# Patient Record
Sex: Male | Born: 1995 | Race: Black or African American | Hispanic: No | Marital: Single | Smoking: Never smoker
Health system: Southern US, Community
[De-identification: ages and names within clinical notes are randomized; demographics above are authoritative.]

## PROBLEM LIST (undated history)

## (undated) DIAGNOSIS — R569 Unspecified convulsions: Secondary | ICD-10-CM

## (undated) DIAGNOSIS — G039 Meningitis, unspecified: Secondary | ICD-10-CM

---

## 2017-03-04 ENCOUNTER — Emergency Department (HOSPITAL_COMMUNITY): Payer: No Typology Code available for payment source

## 2017-03-04 ENCOUNTER — Emergency Department (HOSPITAL_COMMUNITY)
Admission: EM | Admit: 2017-03-04 | Discharge: 2017-03-04 | Disposition: A | Discharge status: Home / Self Care | Payer: No Typology Code available for payment source | Source: Home / Self Care | Attending: Emergency Medicine | Admitting: Emergency Medicine

## 2017-03-04 ENCOUNTER — Encounter (HOSPITAL_COMMUNITY): Payer: Self-pay | Admitting: Emergency Medicine

## 2017-03-04 DIAGNOSIS — T8509XA Other mechanical complication of ventricular intracranial (communicating) shunt, initial encounter: Secondary | ICD-10-CM | POA: Diagnosis not present

## 2017-03-04 DIAGNOSIS — K529 Noninfective gastroenteritis and colitis, unspecified: Secondary | ICD-10-CM

## 2017-03-04 DIAGNOSIS — R51 Headache: Secondary | ICD-10-CM | POA: Insufficient documentation

## 2017-03-04 DIAGNOSIS — R400 Somnolence: Secondary | ICD-10-CM | POA: Diagnosis not present

## 2017-03-04 DIAGNOSIS — G919 Hydrocephalus, unspecified: Secondary | ICD-10-CM

## 2017-03-04 DIAGNOSIS — R519 Headache, unspecified: Secondary | ICD-10-CM

## 2017-03-04 HISTORY — DX: Meningitis, unspecified: G03.9

## 2017-03-04 HISTORY — DX: Unspecified convulsions: R56.9

## 2017-03-04 LAB — CBC
HCT: 46.9 % (ref 39.0–52.0)
Hemoglobin: 16.5 g/dL (ref 13.0–17.0)
MCH: 30.3 pg (ref 26.0–34.0)
MCHC: 35.2 g/dL (ref 30.0–36.0)
MCV: 86.2 fL (ref 78.0–100.0)
PLATELETS: 292 10*3/uL (ref 150–400)
RBC: 5.44 MIL/uL (ref 4.22–5.81)
RDW: 13 % (ref 11.5–15.5)
WBC: 10.2 10*3/uL (ref 4.0–10.5)

## 2017-03-04 LAB — COMPREHENSIVE METABOLIC PANEL
ALBUMIN: 4.2 g/dL (ref 3.5–5.0)
ALK PHOS: 81 U/L (ref 38–126)
ALT: 35 U/L (ref 17–63)
AST: 24 U/L (ref 15–41)
Anion gap: 9 (ref 5–15)
BILIRUBIN TOTAL: 1.2 mg/dL (ref 0.3–1.2)
BUN: 12 mg/dL (ref 6–20)
CALCIUM: 9.3 mg/dL (ref 8.9–10.3)
CO2: 24 mmol/L (ref 22–32)
CREATININE: 0.96 mg/dL (ref 0.61–1.24)
Chloride: 102 mmol/L (ref 101–111)
GFR calc Af Amer: >60 mL/min (ref >60)
GFR calc non Af Amer: >60 mL/min (ref >60)
GLUCOSE: 120 mg/dL — AB (ref 65–99)
Potassium: 3.8 mmol/L (ref 3.5–5.1)
Sodium: 135 mmol/L (ref 135–145)
TOTAL PROTEIN: 8 g/dL (ref 6.5–8.1)

## 2017-03-04 LAB — LIPASE, BLOOD: Lipase: 33 U/L (ref 11–51)

## 2017-03-04 MED ORDER — ONDANSETRON HCL 4 MG/2ML IJ SOLN
4.0000 mg | Freq: Once | INTRAMUSCULAR | Status: AC
  Administered 2017-03-04: 4 mg via INTRAVENOUS
  Filled 2017-03-04: qty 2

## 2017-03-04 MED ORDER — PROMETHAZINE HCL 25 MG PO TABS: 25.0000 mg | ORAL_TABLET | Freq: Four times a day (QID) | ORAL | 1 refills | Status: AC | PRN

## 2017-03-04 MED ORDER — SODIUM CHLORIDE 0.9 % IV SOLN
INTRAVENOUS | Status: DC
  Administered 2017-03-04: 18:00:00 via INTRAVENOUS

## 2017-03-04 MED ORDER — SODIUM CHLORIDE 0.9 % IV BOLUS (SEPSIS)
1000.0000 mL | Freq: Once | INTRAVENOUS | Status: AC
  Administered 2017-03-04: 1000 mL via INTRAVENOUS

## 2017-03-04 MED ORDER — HYDROMORPHONE HCL 1 MG/ML IJ SOLN
1.0000 mg | Freq: Once | INTRAMUSCULAR | Status: AC
  Administered 2017-03-04: 1 mg via INTRAVENOUS
  Filled 2017-03-04: qty 1

## 2017-03-04 MED ORDER — SODIUM CHLORIDE 0.9 % IV BOLUS (SEPSIS)
500.0000 mL | Freq: Once | INTRAVENOUS | Status: AC
  Administered 2017-03-04: 500 mL via INTRAVENOUS

## 2017-03-04 MED ORDER — DIPHENHYDRAMINE HCL 50 MG/ML IJ SOLN
50.0000 mg | Freq: Once | INTRAMUSCULAR | Status: AC
  Administered 2017-03-04: 50 mg via INTRAVENOUS
  Filled 2017-03-04: qty 1

## 2017-03-04 MED ORDER — METOCLOPRAMIDE HCL 5 MG/ML IJ SOLN
10.0000 mg | Freq: Once | INTRAMUSCULAR | Status: AC
Start: 2017-03-04 — End: 2017-03-04
  Administered 2017-03-04: 10 mg via INTRAVENOUS
  Filled 2017-03-04: qty 2

## 2017-03-04 MED ORDER — DEXAMETHASONE SODIUM PHOSPHATE 10 MG/ML IJ SOLN
10.0000 mg | Freq: Once | INTRAMUSCULAR | Status: AC
  Administered 2017-03-04: 10 mg via INTRAVENOUS
  Filled 2017-03-04: qty 1

## 2017-03-04 NOTE — ED Notes (Signed)
Pt transported to CT ?

## 2017-03-04 NOTE — Discharge Instructions (Signed)
Return for any new or worse symptoms. Follow-up with neurology regarding your hydrocephalus which we feel is chronic. Would not expect development of fever return for that. Rest tonight. Take the Phenergan as needed for the nausea.

## 2017-03-04 NOTE — ED Notes (Signed)
Headache started Thursday -- has hx of hydrocephalus with shunt----- no recent care for shunt-- father is retired Midwife-- has not have any follow up since age 21.  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

## 2017-03-04 NOTE — ED Provider Notes (Signed)
[facility]-EMERGENCY DEPT Provider Note   CSN: [id number] Arrival date & time: 03/04/17  1509     History   Chief Complaint Chief Complaint  Patient presents with  . Headache  . Abdominal Pain    HPI Miguel Stafford is a 21 y.o. male.  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 Had a shunt placed. Has not had any revisions to the shunt since age 67. 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 Fairly certain that he has meningitis shot. 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 Patient has not had any history of headaches. Patient started not feeling well on Thursday morning. At headache nausea vomiting and diarrhea. Has continued to have some vomiting. Denies any fever or chills. No blood in the vomit. No significant abdominal pain. No dysuria. No unusual rash.   Patient at triage was noted to be alert and oriented 4. Patient does have some sensitivity to light. Patient denies any other focal neuro deficits. Patient denies any neck pain or low back pain.  Later patient related that he did have a history of a bad headache one time before and they found that he had frontal sinusitis.      Past Medical History:  Diagnosis Date  . Meningitis   . Seizures (HCC)     There are no active problems to display for this patient.   History reviewed. No pertinent surgical history.     Home Medications    Prior to Admission medications   Medication Sig Start Date End Date Taking? Authorizing Provider  acetaminophen (TYLENOL) 500 MG tablet Take 1,000 mg by mouth every 6 (six) hours as needed for mild pain.   Yes [provider]  promethazine (PHENERGAN) 25 MG tablet Take 1 tablet (25 mg total) by mouth every 6 (six) hours as needed for nausea or vomiting. 03/04/17   Vanetta Mulders, MD    Family History No family history on file.  Social History Social History  Substance Use Topics  . Smoking status: Never Smoker    . Smokeless tobacco: Never Used  . Alcohol use No     Allergies   Patient has no known allergies.   Review of Systems Review of Systems  Constitutional: Positive for fatigue. Negative for chills and fever.  HENT: Negative for congestion.   Eyes: Positive for photophobia.  Respiratory: Negative for shortness of breath.   Cardiovascular: Negative for chest pain.  Gastrointestinal: Positive for diarrhea, nausea and vomiting. Negative for abdominal pain.  Genitourinary: Negative for dysuria and hematuria.  Musculoskeletal: Negative for back pain, neck pain and neck stiffness.  Skin: Negative for rash.  Neurological: Positive for headaches. Negative for seizures, syncope, facial asymmetry, speech difficulty, weakness and numbness.  Hematological: Does not bruise/bleed easily.  Psychiatric/Behavioral: Negative for confusion.     Physical Exam Updated Vital Signs BP 117/87   Pulse 84   Temp 98.2 F (36.8 C) (Oral)   Resp 16   Ht 1.702 m ( )   Wt 99.8 kg (220 lb)   SpO2 98%   BMI 34.46 kg/m   Physical Exam  Constitutional: He is oriented to person, place, and time. He appears well-developed and well-nourished. No distress.  HENT:  Head: Normocephalic and atraumatic.  Mouth/Throat: Oropharynx is clear and moist.  Eyes: Pupils are equal, round, and reactive to light. Conjunctivae and EOM are normal.  Neck: Normal range of motion. Neck supple.  Cardiovascular: Normal rate, regular  rhythm and normal heart sounds.   No murmur heard. Pulmonary/Chest: Effort normal and breath sounds normal. No respiratory distress.  Abdominal: Soft. Bowel sounds are normal. There is no tenderness.  Musculoskeletal: Normal range of motion. He exhibits no edema.  Neurological: He is alert and oriented to person, place, and time. No cranial nerve deficit. He exhibits normal muscle tone. Coordination normal.  Skin: Skin is warm. Capillary refill takes less than 2 seconds. No rash noted.   Nursing note and vitals reviewed.    ED Treatments / Results  Labs (all labs ordered are listed, but only abnormal results are displayed) Labs Reviewed  COMPREHENSIVE METABOLIC PANEL - Abnormal; Notable for the following:       Result Value   Glucose, Bld 120 (*)    All other components within normal limits  LIPASE, BLOOD  CBC  URINALYSIS, ROUTINE W REFLEX MICROSCOPIC    EKG  EKG Interpretation None       Radiology Ct Head Wo Contrast  Result Date: 03/04/2017 CLINICAL DATA:  NAUSEA AND VOMITING.  HEADACHE EXAM: CT HEAD WITHOUT CONTRAST TECHNIQUE: Contiguous axial images were obtained from the base of the skull through the vertex without intravenous contrast. COMPARISON:  None. FINDINGS: Brain: No acute intracranial hemorrhage. No focal mass lesion. No CT evidence of acute infarction. No midline shift or mass effect. Basilar cisterns are patent. Ventriculostomy catheter from a RIGHT parietal approach with tip in the RIGHT lateral ventricle. There is moderate hydrocephalus with prominence of the temporal horns. No comparison available. Vascular: No hyperdense vessel or unexpected calcification. Skull: Normal. Negative for fracture or focal lesion. Sinuses/Orbits: Paranasal sinuses and mastoid air cells are clear. Orbits are clear. Other: None. IMPRESSION: 1. 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 No comparison. 2. No acute intracranial findings. Electronically Signed   By: Genevive Bi M.D.   On: 03/04/2017 18:50    Procedures Procedures (including critical care time)  Medications Ordered in ED Medications  0.9 %  sodium chloride infusion ( Intravenous New Bag/Given 03/04/17 1752)  sodium chloride 0.9 % bolus 500 mL (0 mLs Intravenous Stopped 03/04/17 1921)  ondansetron (ZOFRAN) injection 4 mg (4 mg Intravenous Given 03/04/17 1754)  HYDROmorphone (DILAUDID) injection 1 mg (1 mg Intravenous Given 03/04/17 1752)  HYDROmorphone  (DILAUDID) injection 1 mg (1 mg Intravenous Given 03/04/17 1929)  dexamethasone (DECADRON) injection 10 mg (10 mg Intravenous Given 03/04/17 1938)  diphenhydrAMINE (BENADRYL) injection 50 mg (50 mg Intravenous Given 03/04/17 1936)  metoCLOPramide (REGLAN) injection 10 mg (10 mg Intravenous Given 03/04/17 1937)  sodium chloride 0.9 % bolus 1,000 mL (0 mLs Intravenous Stopped 03/04/17 2057)     Initial Impression / Assessment and Plan / ED Course  I have reviewed the triage vital signs and the nursing notes.  Pertinent labs & imaging results that were available during my care of the patient were reviewed by me and considered in my medical decision making (see chart for details).    CT of the head showed moderate hydrocephalus. No CT for comparison. This was discussed with Dr. Roseanne Reno on call for neurology. He reviewed the head CT. Felt that these were most likely chronic in nature did not see any acute changes. And also based on patient's presentation with him being alert and oriented answering question although miserable from the headache had no lethargy. No focal neuro deficits other than the photophobia. That this was not an acute hydrocephalus process and that the shunt was probably working properly.  Based  on this decided to treat him as possible headache or new onset migraine. He is in his 87s. Patient treated with Decadron Reglan and Benadryl and IV fluids. Also earlier had received hydromorphone and got an second dose of hydromorphone. Patient was significant improvement with this. Headache went from 10 out of 10 down to 4 out of 10. Patient stated he felt much better.  Reevaluate the patient carefully. No neck stiffness no back stiffness. Photophobia had resolved. Patient was mentating fine. Able to count backwards from 10-1 without any difficulties. Able to relate a senior at school and what is majoring in. Patient's parents are here and will be able to watch him. Clinically do not feel that  there is any meningitis. There is no fever. There is no meningeal symptoms. Clinically feel that this may very well be new onset migraine certainly think is to be repetitive that'll clinch it. 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 Patient will return for any new or worse symptoms at all.   Final Clinical Impressions(s) / ED Diagnoses   Final diagnoses:  Acute intractable headache, unspecified headache type  Gastroenteritis  Hydrocephalus    New Prescriptions New Prescriptions   PROMETHAZINE (PHENERGAN) 25 MG TABLET    Take 1 tablet (25 mg total) by mouth every 6 (six) hours as needed for nausea or vomiting.     Vanetta Mulders, MD 03/04/17 [phone]

## 2017-03-04 NOTE — ED Notes (Signed)
Dr. Zackowski in room. 

## 2017-03-04 NOTE — ED Triage Notes (Signed)
Pt c/o headache that began Thursday morning as well as abdominal pain that began yesterday. Endorses n/v, denies diarrhea. Pt also c/o sensitivity to light, hx of shunt placement. A&O x 4.

## 2017-03-04 NOTE — ED Notes (Signed)
Pt verbalized understanding of d/c instructions and has no further questions. Pt stable and nAD 

## 2017-03-05 ENCOUNTER — Inpatient Hospital Stay (HOSPITAL_COMMUNITY)
Admission: EM | Admit: 2017-03-05 | Discharge: 2017-03-06 | DRG: 982 | Disposition: A | Discharge status: Home / Self Care | Payer: No Typology Code available for payment source | Attending: Neurosurgery | Admitting: Neurosurgery

## 2017-03-05 ENCOUNTER — Emergency Department (HOSPITAL_COMMUNITY): Payer: No Typology Code available for payment source | Admitting: Certified Registered"

## 2017-03-05 ENCOUNTER — Encounter (HOSPITAL_COMMUNITY)
Admission: EM | Disposition: A | Discharge status: Home / Self Care | Payer: Self-pay | Source: Home / Self Care | Attending: Neurosurgery

## 2017-03-05 ENCOUNTER — Emergency Department (HOSPITAL_COMMUNITY): Payer: No Typology Code available for payment source

## 2017-03-05 ENCOUNTER — Inpatient Hospital Stay (HOSPITAL_COMMUNITY): Payer: No Typology Code available for payment source

## 2017-03-05 ENCOUNTER — Encounter (HOSPITAL_COMMUNITY): Payer: Self-pay | Admitting: *Deleted

## 2017-03-05 DIAGNOSIS — R519 Headache, unspecified: Secondary | ICD-10-CM

## 2017-03-05 DIAGNOSIS — T8509XA Other mechanical complication of ventricular intracranial (communicating) shunt, initial encounter: Secondary | ICD-10-CM | POA: Diagnosis present

## 2017-03-05 DIAGNOSIS — Z8661 Personal history of infections of the central nervous system: Secondary | ICD-10-CM

## 2017-03-05 DIAGNOSIS — Y838 Other surgical procedures as the cause of abnormal reaction of the patient, or of later complication, without mention of misadventure at the time of the procedure: Secondary | ICD-10-CM | POA: Diagnosis present

## 2017-03-05 DIAGNOSIS — G918 Other hydrocephalus: Secondary | ICD-10-CM | POA: Diagnosis present

## 2017-03-05 DIAGNOSIS — R51 Headache: Secondary | ICD-10-CM

## 2017-03-05 DIAGNOSIS — R400 Somnolence: Secondary | ICD-10-CM | POA: Diagnosis present

## 2017-03-05 DIAGNOSIS — R5383 Other fatigue: Secondary | ICD-10-CM | POA: Diagnosis present

## 2017-03-05 DIAGNOSIS — Z452 Encounter for adjustment and management of vascular access device: Secondary | ICD-10-CM

## 2017-03-05 DIAGNOSIS — T85618A Breakdown (mechanical) of other specified internal prosthetic devices, implants and grafts, initial encounter: Secondary | ICD-10-CM | POA: Diagnosis present

## 2017-03-05 HISTORY — PX: SHUNT REVISION: SHX343

## 2017-03-05 LAB — CBG MONITORING, ED: GLUCOSE-CAPILLARY: 105 mg/dL — AB (ref 65–99)

## 2017-03-05 SURGERY — SHUNT REVISION
Anesthesia: General | Site: Head | Laterality: Right

## 2017-03-05 MED ORDER — BACITRACIN ZINC 500 UNIT/GM EX OINT
TOPICAL_OINTMENT | CUTANEOUS | Status: DC | PRN
  Administered 2017-03-05: 1 via TOPICAL

## 2017-03-05 MED ORDER — PROPOFOL 10 MG/ML IV BOLUS
INTRAVENOUS | Status: AC
  Filled 2017-03-05: qty 20

## 2017-03-05 MED ORDER — SUCCINYLCHOLINE CHLORIDE 20 MG/ML IJ SOLN
INTRAMUSCULAR | Status: DC | PRN
  Administered 2017-03-05: 140 mg via INTRAVENOUS

## 2017-03-05 MED ORDER — LABETALOL HCL 5 MG/ML IV SOLN
10.0000 mg | INTRAVENOUS | Status: DC | PRN
Start: 2017-03-05 — End: 2017-03-06

## 2017-03-05 MED ORDER — SUCCINYLCHOLINE CHLORIDE 200 MG/10ML IV SOSY
PREFILLED_SYRINGE | INTRAVENOUS | Status: AC
  Filled 2017-03-05: qty 10

## 2017-03-05 MED ORDER — ONDANSETRON HCL 4 MG PO TABS
4.0000 mg | ORAL_TABLET | ORAL | Status: DC | PRN
Start: 2017-03-05 — End: 2017-03-06

## 2017-03-05 MED ORDER — PANTOPRAZOLE SODIUM 40 MG IV SOLR
40.0000 mg | Freq: Every day | INTRAVENOUS | Status: DC
  Administered 2017-03-06: 40 mg via INTRAVENOUS
  Filled 2017-03-05: qty 40

## 2017-03-05 MED ORDER — ROCURONIUM BROMIDE 100 MG/10ML IV SOLN
INTRAVENOUS | Status: DC | PRN
  Administered 2017-03-05: 10 mg via INTRAVENOUS
  Administered 2017-03-05: 50 mg via INTRAVENOUS

## 2017-03-05 MED ORDER — FENTANYL CITRATE (PF) 100 MCG/2ML IJ SOLN: 25.0000 ug | INTRAMUSCULAR | Status: DC | PRN

## 2017-03-05 MED ORDER — HYDROCODONE-ACETAMINOPHEN 5-325 MG PO TABS
1.0000 | ORAL_TABLET | ORAL | Status: DC | PRN
  Administered 2017-03-06: 1 via ORAL
  Filled 2017-03-05: qty 1

## 2017-03-05 MED ORDER — OXYCODONE HCL 5 MG/5ML PO SOLN: 5.0000 mg | Freq: Once | ORAL | Status: DC | PRN

## 2017-03-05 MED ORDER — THROMBIN 5000 UNITS EX SOLR
CUTANEOUS | Status: DC | PRN
  Administered 2017-03-05 (×2): 5000 [IU] via TOPICAL

## 2017-03-05 MED ORDER — LIDOCAINE-EPINEPHRINE 0.5 %-1:200000 IJ SOLN
INTRAMUSCULAR | Status: AC
  Filled 2017-03-05: qty 1

## 2017-03-05 MED ORDER — PROPOFOL 10 MG/ML IV BOLUS
INTRAVENOUS | Status: DC | PRN
  Administered 2017-03-05: 180 mg via INTRAVENOUS

## 2017-03-05 MED ORDER — CEFAZOLIN SODIUM-DEXTROSE 2-3 GM-% IV SOLR
INTRAVENOUS | Status: DC | PRN
  Administered 2017-03-05: 2 g via INTRAVENOUS

## 2017-03-05 MED ORDER — SODIUM CHLORIDE 0.9 % IV SOLN
INTRAVENOUS | Status: DC | PRN
  Administered 2017-03-05 (×2): via INTRAVENOUS

## 2017-03-05 MED ORDER — OXYCODONE HCL 5 MG PO TABS: 5.0000 mg | ORAL_TABLET | Freq: Once | ORAL | Status: DC | PRN

## 2017-03-05 MED ORDER — ONDANSETRON HCL 4 MG/2ML IJ SOLN
INTRAMUSCULAR | Status: AC
  Filled 2017-03-05: qty 2

## 2017-03-05 MED ORDER — LIDOCAINE 2% (20 MG/ML) 5 ML SYRINGE
INTRAMUSCULAR | Status: AC
  Filled 2017-03-05: qty 5

## 2017-03-05 MED ORDER — DOCUSATE SODIUM 100 MG PO CAPS
100.0000 mg | ORAL_CAPSULE | Freq: Two times a day (BID) | ORAL | Status: DC
  Administered 2017-03-06 (×2): 100 mg via ORAL
  Filled 2017-03-05 (×2): qty 1

## 2017-03-05 MED ORDER — ONDANSETRON HCL 4 MG/2ML IJ SOLN: 4.0000 mg | INTRAMUSCULAR | Status: DC | PRN

## 2017-03-05 MED ORDER — INFLUENZA VAC SPLIT QUAD 0.5 ML IM SUSY
0.5000 mL | PREFILLED_SYRINGE | INTRAMUSCULAR | Status: DC
  Filled 2017-03-05 (×2): qty 0.5

## 2017-03-05 MED ORDER — ONDANSETRON HCL 4 MG/2ML IJ SOLN: 4.0000 mg | Freq: Once | INTRAMUSCULAR | Status: DC | PRN

## 2017-03-05 MED ORDER — ROCURONIUM BROMIDE 10 MG/ML (PF) SYRINGE
PREFILLED_SYRINGE | INTRAVENOUS | Status: AC
  Filled 2017-03-05: qty 5

## 2017-03-05 MED ORDER — FENTANYL CITRATE (PF) 250 MCG/5ML IJ SOLN
INTRAMUSCULAR | Status: AC
  Filled 2017-03-05: qty 5

## 2017-03-05 MED ORDER — SUGAMMADEX SODIUM 200 MG/2ML IV SOLN
INTRAVENOUS | Status: DC | PRN
  Administered 2017-03-05: 200 mg via INTRAVENOUS

## 2017-03-05 MED ORDER — HEMOSTATIC AGENTS (NO CHARGE) OPTIME
TOPICAL | Status: DC | PRN
  Administered 2017-03-05: 1 via TOPICAL

## 2017-03-05 MED ORDER — PHENYLEPHRINE 40 MCG/ML (10ML) SYRINGE FOR IV PUSH (FOR BLOOD PRESSURE SUPPORT)
PREFILLED_SYRINGE | INTRAVENOUS | Status: AC
  Filled 2017-03-05: qty 10

## 2017-03-05 MED ORDER — EPHEDRINE 5 MG/ML INJ
INTRAVENOUS | Status: AC
  Filled 2017-03-05: qty 10

## 2017-03-05 MED ORDER — MORPHINE SULFATE (PF) 4 MG/ML IV SOLN: 1.0000 mg | INTRAVENOUS | Status: DC | PRN

## 2017-03-05 MED ORDER — BACITRACIN ZINC 500 UNIT/GM EX OINT
TOPICAL_OINTMENT | CUTANEOUS | Status: AC
  Filled 2017-03-05: qty 28.35

## 2017-03-05 MED ORDER — NALOXONE HCL 0.4 MG/ML IJ SOLN: 0.0800 mg | INTRAMUSCULAR | Status: DC | PRN

## 2017-03-05 MED ORDER — ACETAMINOPHEN 325 MG PO TABS: 650.0000 mg | ORAL_TABLET | ORAL | Status: DC | PRN

## 2017-03-05 MED ORDER — FENTANYL CITRATE (PF) 100 MCG/2ML IJ SOLN
INTRAMUSCULAR | Status: DC | PRN
  Administered 2017-03-05: 100 ug via INTRAVENOUS
  Administered 2017-03-05 (×3): 50 ug via INTRAVENOUS

## 2017-03-05 MED ORDER — DEXAMETHASONE SODIUM PHOSPHATE 10 MG/ML IJ SOLN
INTRAMUSCULAR | Status: AC
  Filled 2017-03-05: qty 1

## 2017-03-05 MED ORDER — DEXAMETHASONE SODIUM PHOSPHATE 10 MG/ML IJ SOLN
INTRAMUSCULAR | Status: DC | PRN
  Administered 2017-03-05: 10 mg via INTRAVENOUS

## 2017-03-05 MED ORDER — ACETAMINOPHEN 650 MG RE SUPP: 650.0000 mg | RECTAL | Status: DC | PRN

## 2017-03-05 MED ORDER — 0.9 % SODIUM CHLORIDE (POUR BTL) OPTIME
TOPICAL | Status: DC | PRN
  Administered 2017-03-05: 1000 mL

## 2017-03-05 MED ORDER — PROMETHAZINE HCL 25 MG PO TABS: 12.5000 mg | ORAL_TABLET | ORAL | Status: DC | PRN

## 2017-03-05 MED ORDER — POTASSIUM CHLORIDE IN NACL 20-0.9 MEQ/L-% IV SOLN
INTRAVENOUS | Status: DC
  Administered 2017-03-06: 01:00:00 via INTRAVENOUS
  Filled 2017-03-05 (×2): qty 1000

## 2017-03-05 MED ORDER — THROMBIN 5000 UNITS EX SOLR
CUTANEOUS | Status: AC
  Filled 2017-03-05: qty 10000

## 2017-03-05 MED ORDER — ACETAMINOPHEN 500 MG PO TABS: 1000.0000 mg | ORAL_TABLET | Freq: Four times a day (QID) | ORAL | Status: DC | PRN

## 2017-03-05 MED ORDER — CEFAZOLIN SODIUM-DEXTROSE 2-4 GM/100ML-% IV SOLN
2.0000 g | Freq: Three times a day (TID) | INTRAVENOUS | Status: AC
  Administered 2017-03-06 (×2): 2 g via INTRAVENOUS
  Filled 2017-03-05 (×2): qty 100

## 2017-03-05 MED ORDER — ONDANSETRON HCL 4 MG/2ML IJ SOLN
INTRAMUSCULAR | Status: DC | PRN
  Administered 2017-03-05: 4 mg via INTRAVENOUS

## 2017-03-05 MED ORDER — SUGAMMADEX SODIUM 200 MG/2ML IV SOLN
INTRAVENOUS | Status: AC
  Filled 2017-03-05: qty 2

## 2017-03-05 SURGICAL SUPPLY — 99 items
ADULT LUMBAR PUNCTURE TRAY ×3 IMPLANT
BLADE CLIPPER SURG (BLADE) IMPLANT
BLADE SURG 10 STRL SS (BLADE) ×6 IMPLANT
BLADE SURG 11 STRL SS (BLADE) IMPLANT
BLADE SURG 15 STRL LF DISP TIS (BLADE) ×1 IMPLANT
BLADE SURG 15 STRL SS (BLADE) ×2
BOOT SUTURE AID YELLOW STND (SUTURE) IMPLANT
BUR ACORN 6.0 PRECISION (BURR) ×2 IMPLANT
BUR ACORN 6.0MM PRECISION (BURR) ×1
CANISTER SUCT 3000ML PPV (MISCELLANEOUS) ×3 IMPLANT
CARTRIDGE OIL MAESTRO DRILL (MISCELLANEOUS) ×1 IMPLANT
CATH PERITONEAL W/RADIOSTRIP (CATHETERS) ×3 IMPLANT
CLIP RANEY DISP (INSTRUMENTS) IMPLANT
CLOSURE WOUND 1/4X4 (GAUZE/BANDAGES/DRESSINGS)
CONNECTOR SHUNT 1.2MMX1.8MM (MISCELLANEOUS) ×2 IMPLANT
CORD BIPOLAR FORCEPS 12FT (ELECTRODE) ×3 IMPLANT
COVER BACK TABLE 60X90IN (DRAPES) IMPLANT
COVER MAYO STAND STRL (DRAPES) ×3 IMPLANT
DECANTER SPIKE VIAL GLASS SM (MISCELLANEOUS) ×3 IMPLANT
DERMABOND ADVANCED (GAUZE/BANDAGES/DRESSINGS) ×2
DERMABOND ADVANCED .7 DNX12 (GAUZE/BANDAGES/DRESSINGS) ×1 IMPLANT
DIFFUSER DRILL AIR PNEUMATIC (MISCELLANEOUS) ×3 IMPLANT
DRAPE HALF SHEET 40X57 (DRAPES) IMPLANT
DRAPE INCISE IOBAN 85X60 (DRAPES) ×3 IMPLANT
DRAPE ORTHO SPLIT 77X108 STRL (DRAPES) ×2
DRAPE POUCH INSTRU U-SHP 10X18 (DRAPES) ×3 IMPLANT
DRAPE SHEET LG 3/4 BI-LAMINATE (DRAPES) ×3 IMPLANT
DRAPE SURG ORHT 6 SPLT 77X108 (DRAPES) ×1 IMPLANT
DRSG OPSITE POSTOP 4X6 (GAUZE/BANDAGES/DRESSINGS) ×3 IMPLANT
DURAPREP 26ML APPLICATOR (WOUND CARE) ×3 IMPLANT
ELECT CAUTERY BLADE 6.4 (BLADE) ×3 IMPLANT
ELECT REM PT RETURN 9FT ADLT (ELECTROSURGICAL) ×3
ELECTRODE REM PT RTRN 9FT ADLT (ELECTROSURGICAL) ×1 IMPLANT
GAUZE SPONGE 4X4 16PLY XRAY LF (GAUZE/BANDAGES/DRESSINGS) ×3 IMPLANT
GLOVE BIO SURGEON STRL SZ 6.5 (GLOVE) IMPLANT
GLOVE BIO SURGEON STRL SZ7 (GLOVE) IMPLANT
GLOVE BIO SURGEON STRL SZ7.5 (GLOVE) IMPLANT
GLOVE BIO SURGEON STRL SZ8 (GLOVE) IMPLANT
GLOVE BIO SURGEON STRL SZ8.5 (GLOVE) IMPLANT
GLOVE BIO SURGEONS STRL SZ 6.5 (GLOVE)
GLOVE BIOGEL M 8.0 STRL (GLOVE) IMPLANT
GLOVE BIOGEL PI IND STRL 8 (GLOVE) ×6 IMPLANT
GLOVE BIOGEL PI INDICATOR 8 (GLOVE) ×12
GLOVE ECLIPSE 6.5 STRL STRAW (GLOVE) ×3 IMPLANT
GLOVE ECLIPSE 7.0 STRL STRAW (GLOVE) IMPLANT
GLOVE ECLIPSE 7.5 STRL STRAW (GLOVE) IMPLANT
GLOVE ECLIPSE 8.0 STRL XLNG CF (GLOVE) IMPLANT
GLOVE ECLIPSE 8.5 STRL (GLOVE) IMPLANT
GLOVE EXAM NITRILE LRG STRL (GLOVE) IMPLANT
GLOVE EXAM NITRILE XL STR (GLOVE) IMPLANT
GLOVE EXAM NITRILE XS STR PU (GLOVE) IMPLANT
GLOVE INDICATOR 6.5 STRL GRN (GLOVE) IMPLANT
GLOVE INDICATOR 7.0 STRL GRN (GLOVE) IMPLANT
GLOVE INDICATOR 7.5 STRL GRN (GLOVE) IMPLANT
GLOVE INDICATOR 8.0 STRL GRN (GLOVE) IMPLANT
GLOVE INDICATOR 8.5 STRL (GLOVE) IMPLANT
GLOVE OPTIFIT SS 8.0 STRL (GLOVE) IMPLANT
GLOVE SURG SS PI 6.5 STRL IVOR (GLOVE) IMPLANT
GOWN STRL REUS W/ TWL LRG LVL3 (GOWN DISPOSABLE) ×1 IMPLANT
GOWN STRL REUS W/ TWL XL LVL3 (GOWN DISPOSABLE) IMPLANT
GOWN STRL REUS W/TWL 2XL LVL3 (GOWN DISPOSABLE) ×9 IMPLANT
GOWN STRL REUS W/TWL LRG LVL3 (GOWN DISPOSABLE) ×2
GOWN STRL REUS W/TWL XL LVL3 (GOWN DISPOSABLE)
HEMOSTAT SURGICEL 2X14 (HEMOSTASIS) IMPLANT
KIT BASIN OR (CUSTOM PROCEDURE TRAY) ×3 IMPLANT
KIT ROOM TURNOVER OR (KITS) ×3 IMPLANT
LIDOCAINE 0.5% W/EPIM1:200,000. IMPLANT
MARKER SKIN DUAL TIP RULER LAB (MISCELLANEOUS) ×3 IMPLANT
NEEDLE BLUNT 16X1.5 OR ONLY (NEEDLE) ×6 IMPLANT
NEEDLE HYPO 25X1 1.5 SAFETY (NEEDLE) ×3 IMPLANT
NS IRRIG 1000ML POUR BTL (IV SOLUTION) ×3 IMPLANT
OIL CARTRIDGE MAESTRO DRILL (MISCELLANEOUS) ×3
PACK EENT II TURBAN DRAPE (CUSTOM PROCEDURE TRAY) ×3 IMPLANT
PAD ARMBOARD 7.5X6 YLW CONV (MISCELLANEOUS) ×9 IMPLANT
PATTIES SURGICAL .5 X.5 (GAUZE/BANDAGES/DRESSINGS) ×3 IMPLANT
PENCIL BUTTON HOLSTER BLD 10FT (ELECTRODE) ×3 IMPLANT
Peritoneal catheter 91cm ×2 IMPLANT
SHEATH PERITONEAL INTRO 61 (MISCELLANEOUS) ×3 IMPLANT
SPONGE LAP 4X18 X RAY DECT (DISPOSABLE) ×3 IMPLANT
SPONGE SURGIFOAM ABS GEL 12-7 (HEMOSTASIS) IMPLANT
SPONGE SURGIFOAM ABS GEL SZ50 (HEMOSTASIS) ×3 IMPLANT
STAPLER SKIN PROX WIDE 3.9 (STAPLE) ×3 IMPLANT
STRIP CLOSURE SKIN 1/4X4 (GAUZE/BANDAGES/DRESSINGS) IMPLANT
SUT BONE WAX W31G (SUTURE) ×3 IMPLANT
SUT ETHILON 3 0 PS 1 (SUTURE) IMPLANT
SUT NURALON 4 0 TR CR/8 (SUTURE) IMPLANT
SUT SILK 0 TIES 10X30 (SUTURE) ×3 IMPLANT
SUT SILK 3 0 SH 30 (SUTURE) IMPLANT
SUT VIC AB 2-0 CT2 18 VCP726D (SUTURE) ×6 IMPLANT
SUT VIC AB 3-0 SH 8-18 (SUTURE) ×6 IMPLANT
SYR BULB 3OZ (MISCELLANEOUS) ×3 IMPLANT
SYR CONTROL 10ML LL (SYRINGE) ×6 IMPLANT
TOWEL GREEN STERILE (TOWEL DISPOSABLE) ×3 IMPLANT
TOWEL GREEN STERILE FF (TOWEL DISPOSABLE) ×3 IMPLANT
TUBE CONNECTING 12'X1/4 (SUCTIONS) ×1
TUBE CONNECTING 12X1/4 (SUCTIONS) ×2 IMPLANT
UNDERPAD 30X30 (UNDERPADS AND DIAPERS) IMPLANT
WATER STERILE IRR 1000ML POUR (IV SOLUTION) ×3 IMPLANT
straight steel connector ×3 IMPLANT

## 2017-03-05 NOTE — ED Triage Notes (Signed)
Pt has shunt since 20 months old and has been in good health, seizures and has been off meds or seizures since 21 years old.  Pt was referred here by Dr. Alwyn Ren and a retired Retail buyer saw him this am and came back to see if the shunt is working.  Since Thursday morning headache, lethargic, and vomiting and seen here yesterday.  He did get Dilaudid 2 doses and is very sleepy.

## 2017-03-05 NOTE — Anesthesia Procedure Notes (Signed)
Central Venous Catheter Insertion Performed by: Kipp Brood, anesthesiologist Start/End9/23/2018 8:15 PM, 03/05/2017 8:20 AM Patient location: OR. Preanesthetic checklist: patient identified, IV checked, site marked, risks and benefits discussed, surgical consent, monitors and equipment checked, pre-op evaluation and timeout performed Position: supine Hand hygiene performed , maximum sterile barriers used  and Seldinger technique used Catheter size: 8 Fr Central line was placed.Double lumen Procedure performed using ultrasound guided technique. Ultrasound Notes:anatomy identified, needle tip was noted to be adjacent to the nerve/plexus identified and no ultrasound evidence of intravascular and/or intraneural injection Attempts: 1 Following insertion, line sutured, dressing applied and Biopatch. Post procedure assessment: blood return through all ports, free fluid flow and no air  Patient tolerated the procedure well with no immediate complications.

## 2017-03-05 NOTE — H&P (Signed)
Miguel Stafford is an 21 y.o. male.   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 He has a CT from 2002 showing small ventricles and a shunt catheter in place. Since this past Thursday he has had increasing lethargy. Miguel Stafford did have vomiting and nausea on Thursday, along with reports of fairly severe headaches. He was brought to the ED yesterday, was lethargic, but following commands, arousable to voice, moving all extremities. He received a cocktail yesterday, and had a CT scan which showed enlarged ventricles. He was sent home, but awoke this am with a severe headache. His parents brought him back to the hospital. His father is a Careers adviser, and Miguel Stafford is adopted. His shunt is a [place] shunt and was [place] in [place].  Past Medical History:  Diagnosis Date  . Meningitis   . Seizures (HCC)     History reviewed. No pertinent surgical history.  No family history on file. Social History:  reports that he has never smoked. He has never used smokeless tobacco. He reports that he does not drink alcohol or use drugs.  Allergies: No Known Allergies   (Not in a hospital admission)  Results for orders placed or performed during the hospital encounter of 03/05/17 (from the past 48 hour(s))  CBG monitoring, ED     Status: Abnormal   Collection Time: 03/05/17  1:11 PM  Result Value Ref Range   Glucose-Capillary 105 (H) 65 - 99 mg/dL   Dg Skull 1-3 Views  Result Date: 03/05/2017 CLINICAL DATA:  Headache, lethargy, vomiting. Evaluate for shunt malfunction. EXAM: SKULL - 1-3 VIEW COMPARISON:  CT HEAD March 04, 2017 FINDINGS: There is no evidence of skull fracture or other focal bone lesions. Ventriculoperitoneal shunt via RIGHT parietal burr hole, shunt is contiguous within the included segments and, is similar in position compared to yesterday's CT HEAD. IMPRESSION: Stable  position of contiguous ventriculoperitoneal shunt. Electronically Signed   By: Awilda Metro M.D.   On: 03/05/2017 17:47   Dg Chest 1 View  Result Date: 03/05/2017 CLINICAL DATA:  Headache and lethargy since Thursday with vomiting. Query shunt malfunction. EXAM: CHEST 1 VIEW COMPARISON:  None. FINDINGS: The heart size and mediastinal contours are within normal limits. Shunt tubing is seen traversing over the medial aspect of the right hemithorax. No disruption or kinking of the tubing is identified. Both lungs are clear. The visualized skeletal structures are unremarkable. IMPRESSION: No active disease. Right-sided shunt tubing without recognized complications. Electronically Signed   By: Tollie Eth M.D.   On: 03/05/2017 17:46   Dg Cervical Spine 1 View  Result Date: 03/05/2017 CLINICAL DATA:  Headache and lethargy since Thursday. History of VP shunt. Query malfunction of the shunt. EXAM: CERVICAL SPINE 1 VIEW COMPARISON:  None. FINDINGS: Shunt tubing is seen coursing along the right lateral aspect of the soft tissues of the neck and medial aspect of the right hemithorax. No kinking or disruption of the tubing is identified. IMPRESSION: Negative cervical spine radiographs. Intact VP shunt to the extent visualized. Electronically Signed   By: Tollie Eth M.D.   On: 03/05/2017 17:49   Dg Abd 1 View  Result Date: 03/05/2017 CLINICAL DATA:  Headache and lethargy since Thursday. Question VP shunt malfunction. EXAM: ABDOMEN - 1 VIEW COMPARISON:  None. FINDINGS: The bowel gas pattern is normal. The tip of a VP shunt is seen projecting over the left mid  abdomen. No kinking of the shunt tubing or disruption is noted. Bowel gas pattern is unremarkable. There is no free air, organomegaly or suspicious radiopaque calculi. IMPRESSION: Shunt tubing is seen with tip in the left hemiabdomen. No disruption of the tubing or definite kinking. Electronically Signed   By: Tollie Eth M.D.   On: 03/05/2017 17:48   Ct  Head Wo Contrast  Result Date: 03/04/2017 CLINICAL DATA:  NAUSEA AND VOMITING.  HEADACHE EXAM: CT HEAD WITHOUT CONTRAST TECHNIQUE: Contiguous axial images were obtained from the base of the skull through the vertex without intravenous contrast. COMPARISON:  None. FINDINGS: Brain: No acute intracranial hemorrhage. No focal mass lesion. No CT evidence of acute infarction. No midline shift or mass effect. Basilar cisterns are patent. Ventriculostomy catheter from a RIGHT parietal approach with tip in the RIGHT lateral ventricle. There is moderate hydrocephalus with prominence of the temporal horns. No comparison available. Vascular: No hyperdense vessel or unexpected calcification. Skull: Normal. Negative for fracture or focal lesion. Sinuses/Orbits: Paranasal sinuses and mastoid air cells are clear. Orbits are clear. Other: None. IMPRESSION: 1. 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 No comparison. 2. No acute intracranial findings. Electronically Signed   By: Genevive Bi M.D.   On: 03/04/2017 18:50    Review of Systems  Constitutional: Negative.   HENT: Negative.   Eyes: Negative.   Respiratory: Negative.   Cardiovascular: Negative.   Gastrointestinal: Positive for vomiting.  Genitourinary: Negative.   Musculoskeletal: Negative.   Skin: Negative.   Neurological:       Increased lethargy since this past thursday  Psychiatric/Behavioral: Negative.     Blood pressure 124/73, pulse 68, temperature 98.5 F (36.9 C), temperature source Oral, resp. rate (!) 24, height 5' 7.5" (1.715 m), weight 100.7 kg (222 lb), SpO2 98 %. Physical Exam   Assessment/Plan OR for shunt evaluation. Risks and benefits explained, bleeding, infection, brain damage, shunt failure, shunt malfunction along with other risks. Dr.and Mrs. Hitt agree to operative shunt evaluation.   Karsen Nakanishi L, MD 03/05/2017, 6:31 PM

## 2017-03-05 NOTE — Anesthesia Preprocedure Evaluation (Addendum)
Anesthesia Evaluation  Patient identified by MRN, date of birth, ID band Patient awake    Reviewed: Allergy & Precautions, NPO status , Patient's Chart, lab work & pertinent test results  Airway Mallampati: II  TM Distance: >3 FB Neck ROM: Full    Dental  (+) Teeth Intact   Pulmonary    breath sounds clear to auscultation       Cardiovascular  Rhythm:Regular Rate:Normal     Neuro/Psych    GI/Hepatic   Endo/Other    Renal/GU      Musculoskeletal   Abdominal   Peds  Hematology   Anesthesia Other Findings Patient lethargic moving all extremities, answering simple questions.  Reproductive/Obstetrics                             Anesthesia Physical Anesthesia Plan  ASA: III and emergent  Anesthesia Plan: General   Post-op Pain Management:    Induction: Intravenous, Rapid sequence and Cricoid pressure planned  PONV Risk Score and Plan: 1 and Ondansetron and Dexamethasone  Airway Management Planned: Oral ETT  Additional Equipment:   Intra-op Plan:   Post-operative Plan: Extubation in OR  Informed Consent: I have reviewed the patients History and Physical, chart, labs and discussed the procedure including the risks, benefits and alternatives for the proposed anesthesia with the patient or authorized representative who has indicated his/her understanding and acceptance.   Dental advisory given  Plan Discussed with: Anesthesiologist and CRNA  Anesthesia Plan Comments:         Anesthesia Quick Evaluation

## 2017-03-05 NOTE — ED Notes (Signed)
Pt's father reports pt was seen here for same yesterday, but states pt is more lethargic today.  Pt has a VP shunt placed in [place] when he was 50 months old.  Has not had any problems until yesterday.  Pt also c/o a frontal h/a.  Pt is oriented x 4, lethargic but responds to voice.

## 2017-03-05 NOTE — Op Note (Signed)
03/05/2017  9:59 PM  PATIENT:  Miguel Stafford  21 y.o. male with increased lethargy, and presumed shunt malfunction  PRE-OPERATIVE DIAGNOSIS:  shunt revision, distal  POST-OPERATIVE DIAGNOSIS:  shunt revision, distal  PROCEDURE:  Procedure(s): RIGHT distal ventriculoperitoneal SHUNT REVISION  SURGEON: Surgeon(s): Coletta Memos, MD  ASSISTANTS:none  ANESTHESIA:   general  EBL:  Total I/O In: 1300 [I.V.:1300] Out: 610 [Urine:600; Blood:10]  BLOOD ADMINISTERED:none  CELL SAVER GIVEN:none  COUNT:per nursing  DRAINS: none   SPECIMEN:  Source of Specimen:  ventricle, lateral  DICTATION: Miguel Stafford was taken to the operating room, intubated, and placed under a general anesthetic without difficulty. He was positioned supine. His head, neck, and abdomen was prepped and draped in a sterile manner. I first opened just at the clavicle where the shunt tubing was easily palpated. I cut down on the tubing, dissected it free from the soft tissue, and secured it proximally, and distally. I divided the catheter and csf was spontaneously flowing from the proximal portion of the catheter. This was evidence of normal valve and ventricular catheter function. I then inserted a blunt tip needle into the distal shunt tubing, and using a manometer assessed the function. The saline did not flow spontaneously out of the manometer indicating a distal malfunction. I tried to pull the distal tubing out of the abdomen but it snapped and distal tubing was left in the abdomen.  I then opened the abdominal incision and dissected to the peritoneum, which I opened. I tunneled from the abdominal incision to the clavicle incision, and pulled a silk tie from the cervical incision to the abdominal incision. I used a straight metal connector to splice the proximal tubing to the new distal tubing and secured each end with a silk tie. I pulled the catheter through to the abdominal incision, observed spontaneous flow,  then inserted the tubing into the peritoneum. I closed the wound in layers approximating the posterior rectal fascia, and anterior rectal fascia. I closed the subcutaneous and subcuticular layers with vicryl sutures. I approximated the skin edges with staples. I then closed the cervical incision in layers with vicryl sutures. Sterile dressings were applied to both incisions.  PLAN OF CARE: Admit to inpatient   PATIENT DISPOSITION:  PACU - hemodynamically stable.   Delay start of Pharmacological VTE agent (>24hrs) due to surgical blood loss or risk of bleeding:  yes

## 2017-03-05 NOTE — Anesthesia Postprocedure Evaluation (Signed)
Anesthesia Post Note  Patient: Miguel Stafford  Procedure(s) Performed: Procedure(s) (LRB): RIGHT SHUNT REVISION (Right)     Patient location during evaluation: PACU Anesthesia Type: General Level of consciousness: awake, awake and alert and oriented Pain management: pain level controlled Vital Signs Assessment: post-procedure vital signs reviewed and stable Respiratory status: spontaneous breathing, nonlabored ventilation and respiratory function stable Cardiovascular status: blood pressure returned to baseline Anesthetic complications: no    Last Vitals:  Vitals:   03/05/17 2245 03/05/17 2303  BP: 132/83 137/89  Pulse:  81  Resp:  16  Temp: 36.6 C   SpO2:  100%    Last Pain:  Vitals:   03/05/17 2245  TempSrc:   PainSc: 0-No pain                 Isella Slatten COKER

## 2017-03-05 NOTE — ED Provider Notes (Signed)
Please see previous physicians note regarding patient's presenting history and physical, initial ED course, and associated medical decision making.   He is evaluated by Dr. Franky Macho from neurosurgery. He will take to OR for evaluation of shunt failure.    Lavera Guise, MD 03/05/17 [phone]

## 2017-03-05 NOTE — Anesthesia Procedure Notes (Addendum)
Procedure Name: Intubation Date/Time: 03/05/2017 8:13 PM Performed by: Manuela Schwartz B Pre-anesthesia Checklist: Patient identified, Emergency Drugs available, Suction available and Patient being monitored Patient Re-evaluated:Patient Re-evaluated prior to induction Oxygen Delivery Method: Circle System Utilized Preoxygenation: Pre-oxygenation with 100% oxygen Induction Type: IV induction and Rapid sequence Laryngoscope Size: Mac and 4 Grade View: Grade I Tube type: Oral Tube size: 7.5 mm Number of attempts: 1 Airway Equipment and Method: Stylet and Oral airway Placement Confirmation: ETT inserted through vocal cords under direct vision,  positive ETCO2 and breath sounds checked- equal and bilateral Secured at: 22 cm Tube secured with: Tape Dental Injury: Teeth and Oropharynx as per pre-operative assessment

## 2017-03-05 NOTE — ED Notes (Addendum)
Pt is resting comfortably, remains lethargic but responds to verbal stimuli.  Remains oriented.  Family remains at bedside.  Waiting for neurosurgery

## 2017-03-05 NOTE — ED Provider Notes (Signed)
[facility]-EMER[facility]ENCY DEPT Provider Note   CSN: [id number] Arrival date & time: 03/05/17  1130     History   Chief Complaint Chief Complaint  Patient presents with  . Altered Mental Status    HPI Miguel Stafford is a 20 y.o. male.  HPI   20yM with headache and lethargy. Onset on Thursday and persistent since then. Nausea and vomiting. Seen at infirmary at [facility] and advised to take tylenol. Vomiting has since resolved but HA and lethargy has continued. He was in the emergency room yesterday a[place]ent[place]Qu46[place] dat6[facility]8[employer]da [employer] developerxieLar2.133ThomasSeriousBroker.itat6[facility]6Inda [employer] deve4moo nd medicated for years but stopped around age of 4 and none since. Slow in some areas of development like fine motor skills and some mild learning problems but progressed and currently a student at [employer].  Father reports last imaging was years ago and ventricles were "slit like" at that time.   He is returning again today with family because he continues to intermittently c/o HA and remains lethargic. On my exam he is very drowsy. He denies pain anywhere but I question his reliability.   Past Medical History:  Diagnosis Date  . Meningitis   . Seizures (HCC)     There are no active problems to display for this patient.   History reviewed. No pertinent surgical history.     Home Medications    Prior to Admission medications   Medication Sig Start Date End Date Taking? Authorizing Provider  acetaminophen (TYLENOL) 500 MG tablet Take 1,000 mg by mouth every 6 (six) hours as needed for mild pain.   Yes [provider]  promethazine (PHENERGAN) 25 MG tablet Take 1 tablet (25 mg total) by mouth every 6 (six) hours as needed for nausea or vomiting. 03/04/17  Yes Zackowski, Scott, MD     Family History No family history on file.  Social History Social History  Substance Use Topics  . Smoking status: Never Smoker  . Smokeless tobacco: Never Used  . Alcohol use No     Allergies   Patient has no known allergies.   Review of Systems Review of Systems  All systems reviewed and negative, other than as noted in HPI.   Physical Exam Updated Vital Signs BP 134/74 (BP Location: Left Arm)   Pulse (!) 57   Temp 98.5 F (36.9 C) (Oral)   Resp 17   Ht 5' 7.5" (1.715 m)   Wt 100.7 kg (222 lb)   SpO2 100%   BMI 34.26 kg/m   Physical Exam  Constitutional: He is oriented to person, place, and time. He appears well-developed and well-nourished. No distress.  HENT:  Head: Normocephalic and atraumatic.  Eyes: Pupils are equal, round, and reactive to light. Conjunctivae are normal. Right eye exhibits no discharge. Left eye exhibits no discharge.  Neck: Neck supple.  Can touch chin to chest and rotate to either side w/o apparent discomfort  Cardiovascular: Normal rate, regular rhythm and normal heart sounds.  Exam reveals no gallop and no friction rub.   No murmur heard. Pulmonary/Chest: Effort normal and breath sounds normal. No respiratory distress.  Abdominal: Soft. He exhibits no distension. There is no tenderness.  Musculoskeletal: He exhibits no edema or tenderness.  Neurological: He is oriented to person,  place, and time. No cranial nerve deficit or sensory deficit. He exhibits normal muscle tone.  Drowsy. Opens eyes to voice and will answer questions but needs constant stimulation to stay engaged. PERRL. CN 2-12 intact. Strength 5/5 b/l u/l ext. Good finger to nose b/l.   Skin: Skin is warm and dry.  Psychiatric: He has a normal mood and affect. His behavior is normal. Thought content normal.  Nursing note and vitals reviewed.    ED Treatments / Results  Labs (all labs ordered are listed, but only abnormal results are displayed) Labs Reviewed  CBG  MONITORING, ED - Abnormal; Notable for the following:       Result Value   Glucose-Capillary 105 (*)    All other components within normal limits    EKG  EKG Interpretation None       Radiology Ct Head Wo Contrast  Result Date: 03/04/2017 CLINICAL DATA:  NAUSEA AND VOMITING.  HEADACHE EXAM: CT HEAD WITHOUT CONTRAST TECHNIQUE: Contiguous axial images were obtained from the base of the skull through the vertex without intravenous contrast. COMPARISON:  None. FINDINGS: Brain: No acute intracranial hemorrhage. No focal mass lesion. No CT evidence of acute infarction. No midline shift or mass effect. Basilar cisterns are patent. Ventriculostomy catheter from a RIGHT parietal approach with tip in the RIGHT lateral ventricle. There is moderate hydrocephalus with prominence of the temporal horns. No comparison available. Vascular: No hyperdense vessel or unexpected calcification. Skull: Normal. Negative for fracture or focal lesion. Sinuses/Orbits: Paranasal sinuses and mastoid air cells are clear. Orbits are clear. Other: None. IMPRESSION: 1. 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 No comparison. 2. No acute intracranial findings. Electronically Signed   By: Genevive Bi M.D.   On: 03/04/2017 18:50    Procedures Procedures (including critical care time)  Medications Ordered in ED Medications - No data to display   Initial Impression / Assessment and Plan / ED Course  I have reviewed the triage vital signs and the nursing notes.  Pertinent labs & imaging results that were available during my care of the patient were reviewed by me and considered in my medical decision making (see chart for details).     20yM with HA and lethargy. CT yesterday with moderate hydrocephalus w/o any imaging for comparison. Afebrile. No nuchal rigidity. Labs from yesterday unremarkable. Symptoms not improved. Discussed with Dr Franky Macho, neurosurgery.  Final  Clinical Impressions(s) / ED Diagnoses   Final diagnoses:  Headache    New Prescriptions New Prescriptions   No medications on file     Raeford Razor, MD 03/05/17 1723

## 2017-03-05 NOTE — Transfer of Care (Signed)
Immediate Anesthesia Transfer of Care Note  Patient: Miguel Stafford  Procedure(s) Performed: Procedure(s) with comments: RIGHT SHUNT REVISION (Right) - right   Patient Location: PACU  Anesthesia Type:General  Level of Consciousness: responds to stimulation  Airway & Oxygen Therapy: Patient Spontanous Breathing  Post-op Assessment: Report given to RN and Post -op Vital signs reviewed and stable  Post vital signs: Reviewed and stable  Last Vitals:  Vitals:   03/05/17 1830 03/05/17 2207  BP: 126/80 131/81  Pulse:  100  Resp: 17 19  Temp:  (!) 36.4 C  SpO2:      Last Pain:  Vitals:   03/05/17 1151  TempSrc:   PainSc: 6          Complications: No apparent anesthesia complications

## 2017-03-05 NOTE — ED Notes (Signed)
Patient transported to X-ray 

## 2017-03-06 LAB — MRSA PCR SCREENING: MRSA BY PCR: NEGATIVE

## 2017-03-06 MED ORDER — CHLORHEXIDINE GLUCONATE CLOTH 2 % EX PADS
6.0000 | MEDICATED_PAD | Freq: Every day | CUTANEOUS | Status: DC
  Administered 2017-03-06: 6 via TOPICAL

## 2017-03-06 NOTE — Discharge Summary (Addendum)
Physician Discharge Summary  Patient ID: Miguel Stafford MRN: [id number] DOB/AGE: [birth year] 20 y.o.  Admit date: 03/05/2017 Discharge date: 03/06/2017  Admission Diagnoses:Shunt malfunction  Discharge Diagnoses: distal shunt obstruction Active Problems:   Shunt malfunction   Malfunction of ventriculo-peritoneal shunt, initial encounter Miguel Stafford)   Discharged Condition: good  Hospital Course: Miguel Stafford presented to the Miguel Stafford ED with increasing lethargy, and enlarged ventricles. He was taken to the OR for interrogation of his Miguel Stafford shunt system. The ventricular catheter and valve were fully operational but he had a distal obstruction. I changed the distal tubing, leaving in place some of the old catheter. Post op his headache is much improved as is his lethargy. His wounds are clean, dry, and without signs of infection.  Consults: None  Significant Diagnostic Studies: microbiology: csf  Treatments: surgery: VP shunt revision  Discharge Exam: Blood pressure 130/78, pulse (!) 108, temperature 98 F (36.7 C), temperature source Oral, resp. rate 19, height 5' 7.5" (1.715 m), weight 100.7 kg (222 lb), SpO2 96 %. General appearance: alert, cooperative, appears stated age and no distress Neurologic: Alert and oriented X 3, normal strength and tone. Normal symmetric reflexes. Normal coordination and gait Csf was clean.  Disposition: 01-Home or Self Care   Allergies as of 03/06/2017   No Known Allergies     Medication List    TAKE these medications   acetaminophen 500 MG tablet Commonly known as:  TYLENOL Take 1,000 mg by mouth every 6 (six) hours as needed for mild pain.   promethazine 25 MG tablet Commonly known as:  PHENERGAN Take 1 tablet (25 mg total) by mouth every 6 (six) hours as needed for nausea or vomiting.      Follow-up Information    Miguel Memos, MD Follow up in 10 day(s).   Specialty:  Neurosurgery Why:  staple removal, please call the office Contact  information: [address] [place] [address] [phone]           Signed: Carmela Hurt 03/06/2017, 3:05 PM

## 2017-03-06 NOTE — Discharge Instructions (Signed)
.  kc °

## 2017-03-07 ENCOUNTER — Encounter (HOSPITAL_COMMUNITY): Payer: Self-pay | Admitting: Neurosurgery

## 2017-03-09 LAB — CATH TIP CULTURE: Culture: NO GROWTH

## 2017-03-09 LAB — CSF CULTURE W GRAM STAIN: Gram Stain: NONE SEEN

## 2017-03-09 LAB — CSF CULTURE: CULTURE: NO GROWTH

## 2018-10-11 IMAGING — CT CT HEAD W/O CM
4 series · 16 of 47 positions shown, 18 images · non-contrast
Comparison: None.

CLINICAL DATA: NAUSEA AND VOMITING.  HEADACHE

EXAM:
CT HEAD WITHOUT CONTRAST
TECHNIQUE: Contiguous axial images were obtained from the base of the skull
through the vertex without intravenous contrast.

[Series 3: head wo · axial · 0.46mm/px · z∈[-163,-43]mm · 7 of 33 slices shown, 9 images]
[im 5/33  brain]
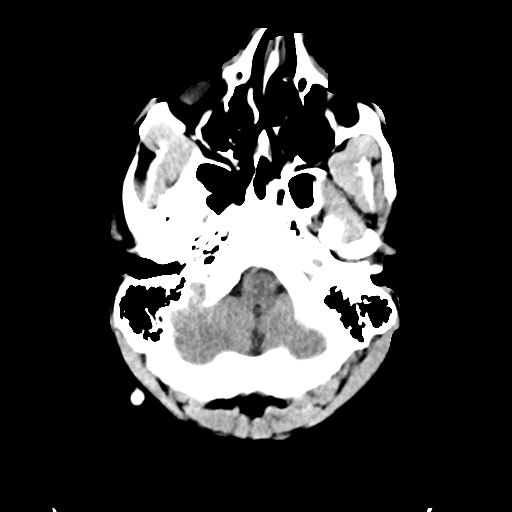
[im 5/33  bone]
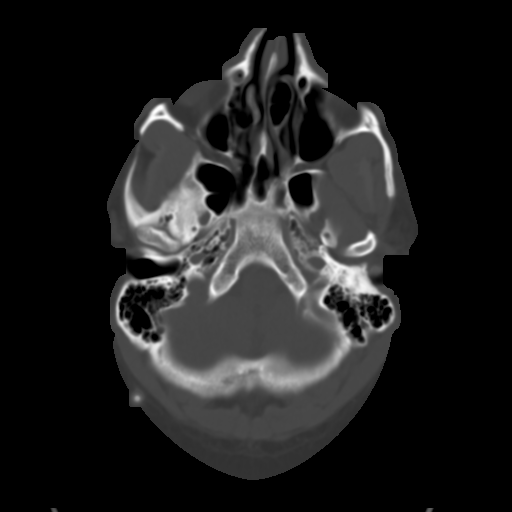
[im 9/33  brain]
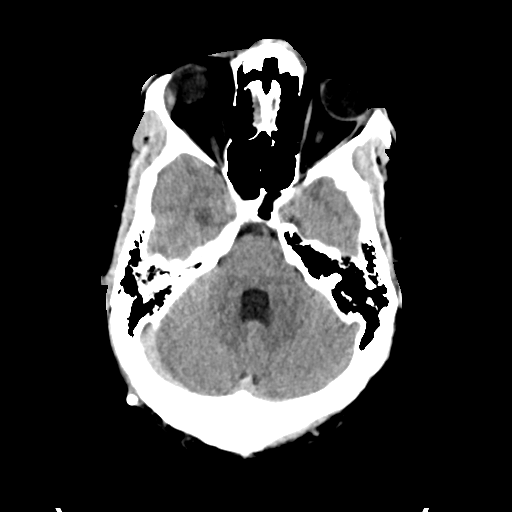
[im 13/33  brain]
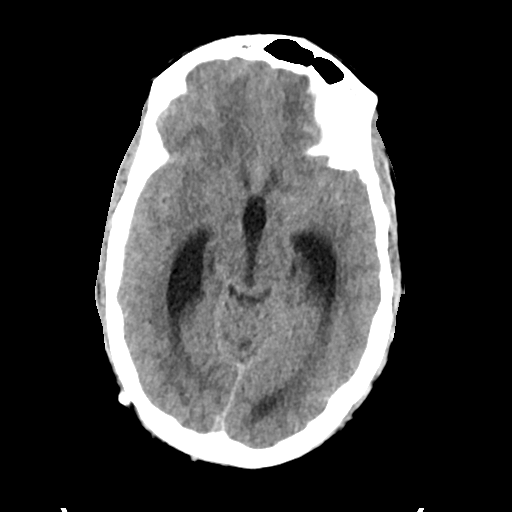
[im 17/33  brain]
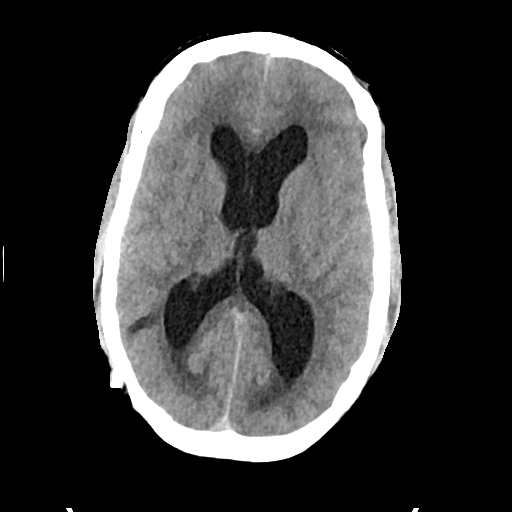
[im 21/33  brain]
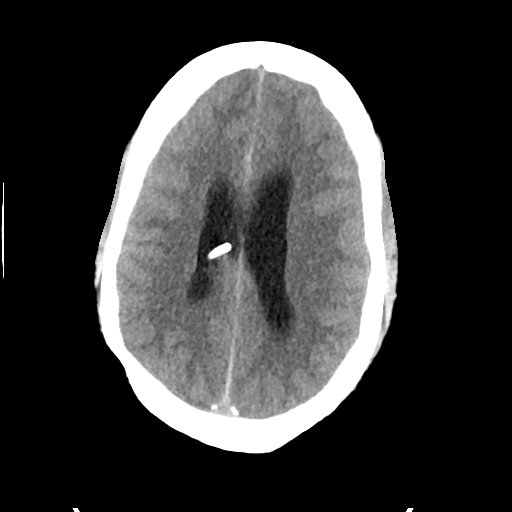
[im 21/33  bone]
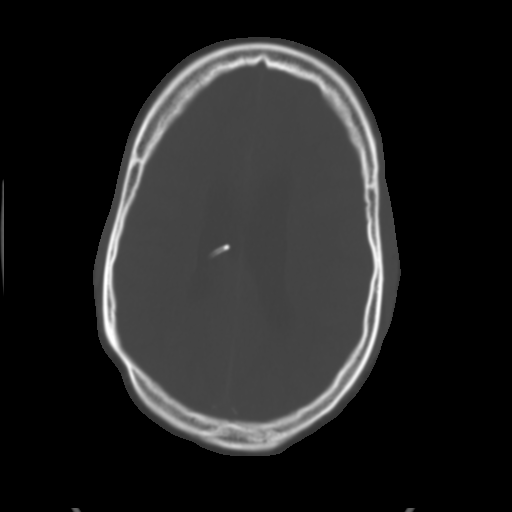
[im 25/33  brain]
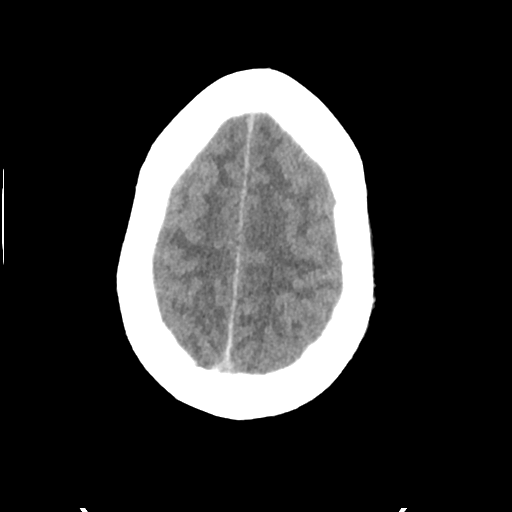
[im 29/33  brain]
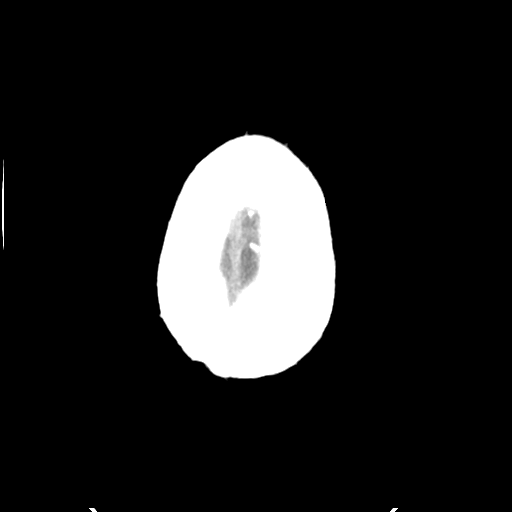

[Series 4: head bone · axial · 0.46mm/px · z∈[-167,-135]mm · 3 of 83 slices shown]
[im 9/83  bone]
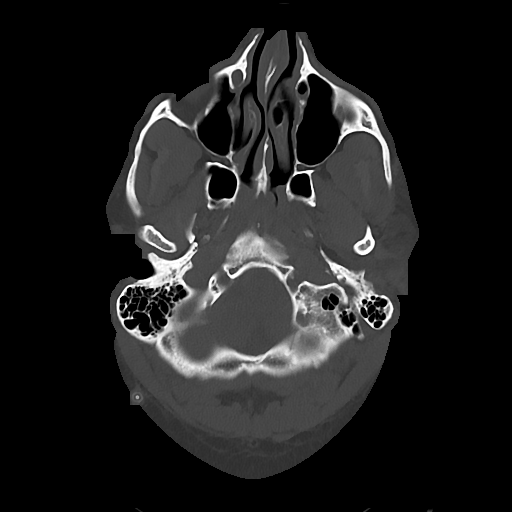
[im 17/83  bone]
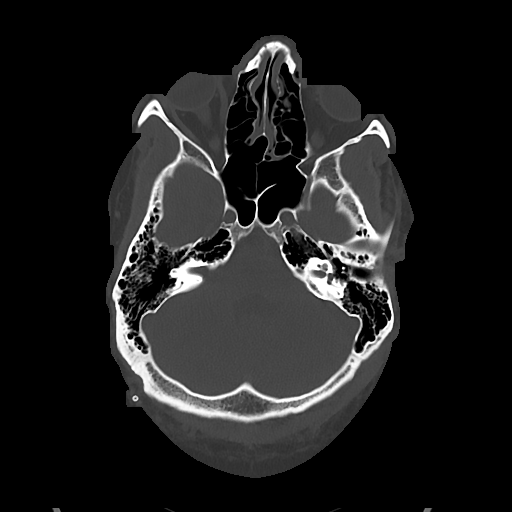
[im 25/83  bone]
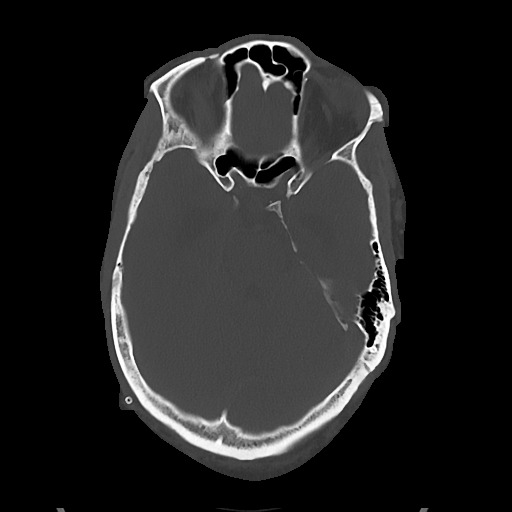

[Series 5: cor soft · coronal · 0.35mm/px · 3 of 81 slices shown]
[im 27/81  brain]
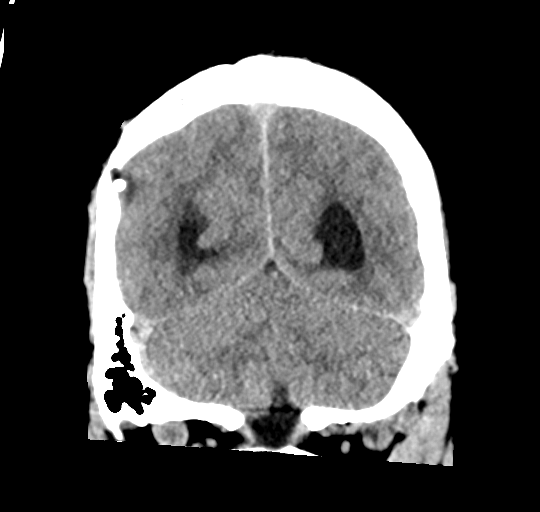
[im 36/81  brain]
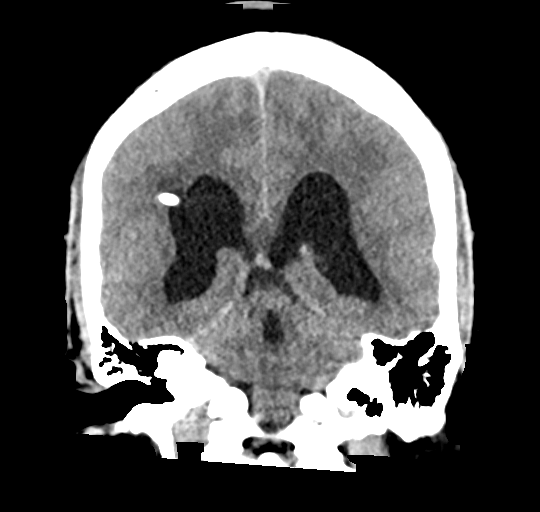
[im 45/81  brain]
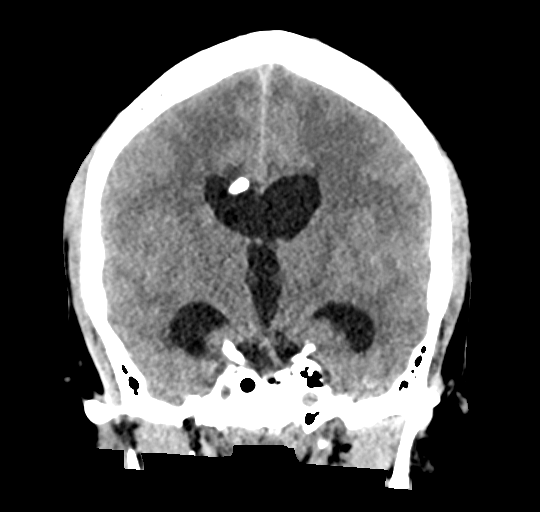

[Series 6: sag soft · sagittal · 0.34mm/px · 3 of 67 slices shown]
[im 23/67  brain]
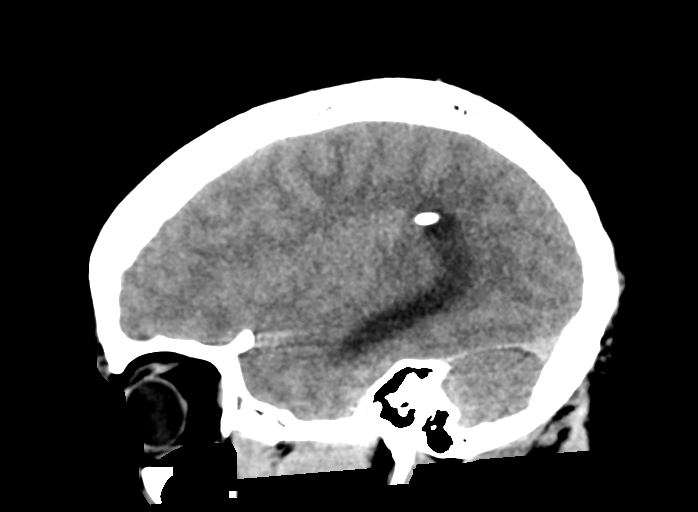
[im 34/67  brain]
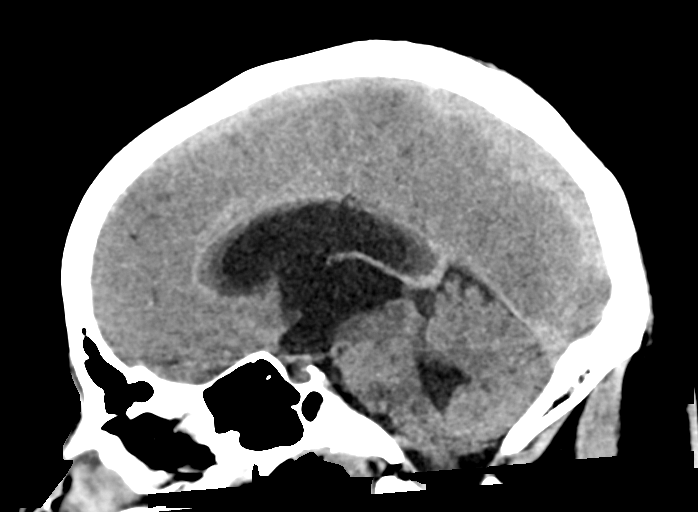
[im 45/67  brain]
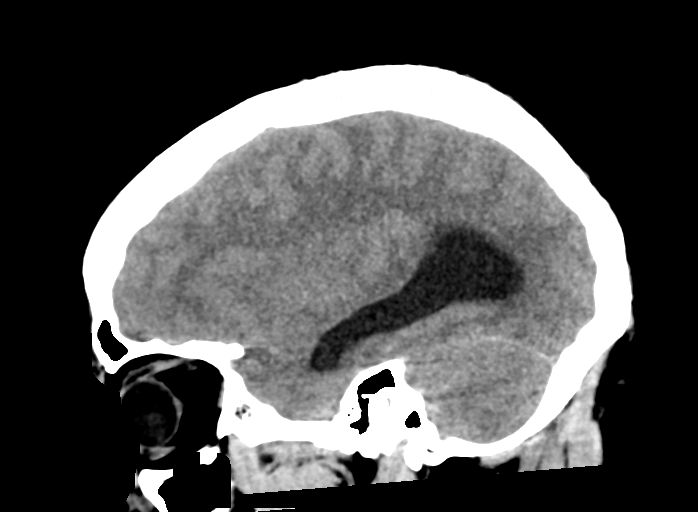

[16 of 47 positions shown; findings below may reference images not displayed]

FINDINGS: Brain: No acute intracranial hemorrhage. No focal mass lesion. No CT
evidence of acute infarction. No midline shift or mass effect.
Basilar cisterns are patent.

Ventriculostomy catheter from a RIGHT parietal approach with tip in
the RIGHT lateral ventricle. There is moderate hydrocephalus with
prominence of the temporal horns. No comparison available.

Vascular: No hyperdense vessel or unexpected calcification.

Skull: Normal. Negative for fracture or focal lesion.

Sinuses/Orbits: Paranasal sinuses and mastoid air cells are clear.
Orbits are clear.

Other: None.
IMPRESSION: 1. Moderate hydrocephalus with ventriculostomy catheter in the RIGHT
lateral ventricle. No comparison.
2. No acute intracranial findings.

## 2018-10-12 IMAGING — DX DG ABDOMEN 1V
1 series · 1 of 1 positions shown · non-contrast
Comparison: None.

CLINICAL DATA: Headache and lethargy since [REDACTED]. Question VP
shunt malfunction.

EXAM:
ABDOMEN - 1 VIEW

[t abdomen supine]
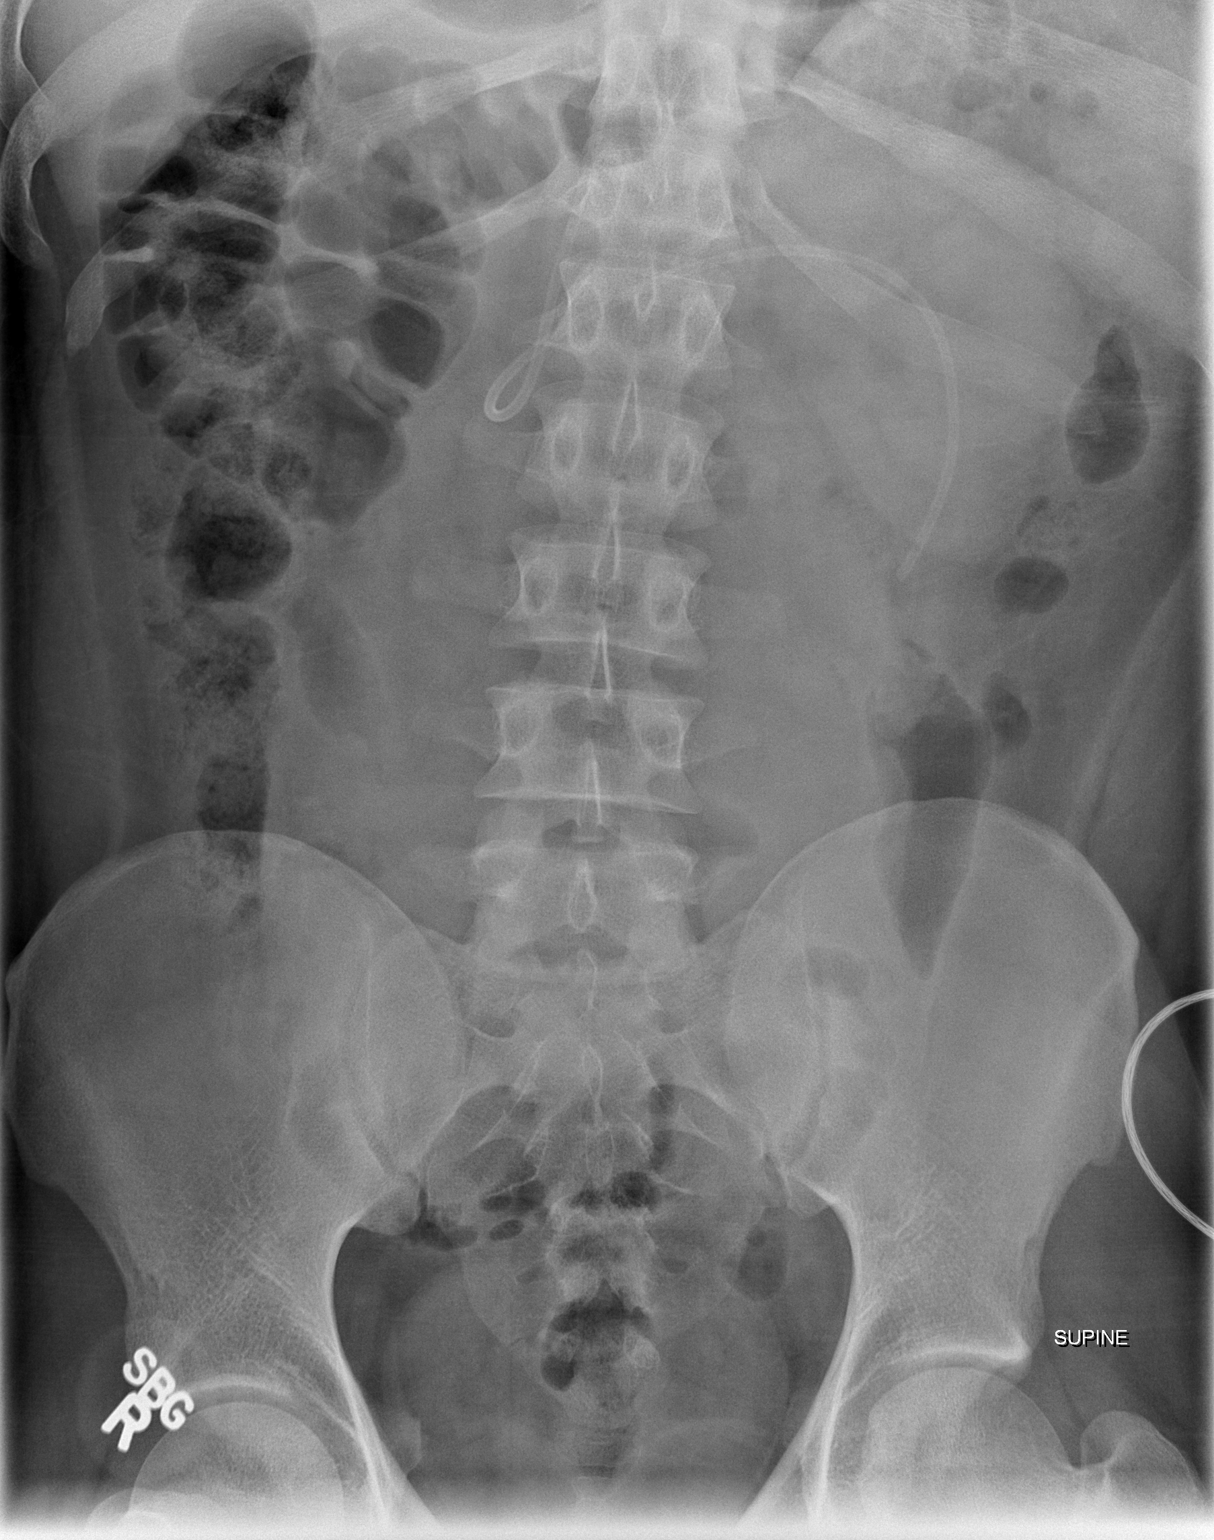

[1 of 1 positions shown; findings below may reference images not displayed]

FINDINGS: The bowel gas pattern is normal. The tip of a VP shunt is seen
projecting over the left mid abdomen. No kinking of the shunt tubing
or disruption is noted. Bowel gas pattern is unremarkable. There is
no free air, organomegaly or suspicious radiopaque calculi.
IMPRESSION: Shunt tubing is seen with tip in the left hemiabdomen. No disruption
of the tubing or definite kinking.

## 2018-10-12 IMAGING — CR DG CHEST 1V PORT
1 series · 1 of 1 positions shown · non-contrast
Comparison: none

CLINICAL DATA: Central line placement

EXAM:
PORTABLE CHEST 1 VIEW
03/05/2017

[AP]
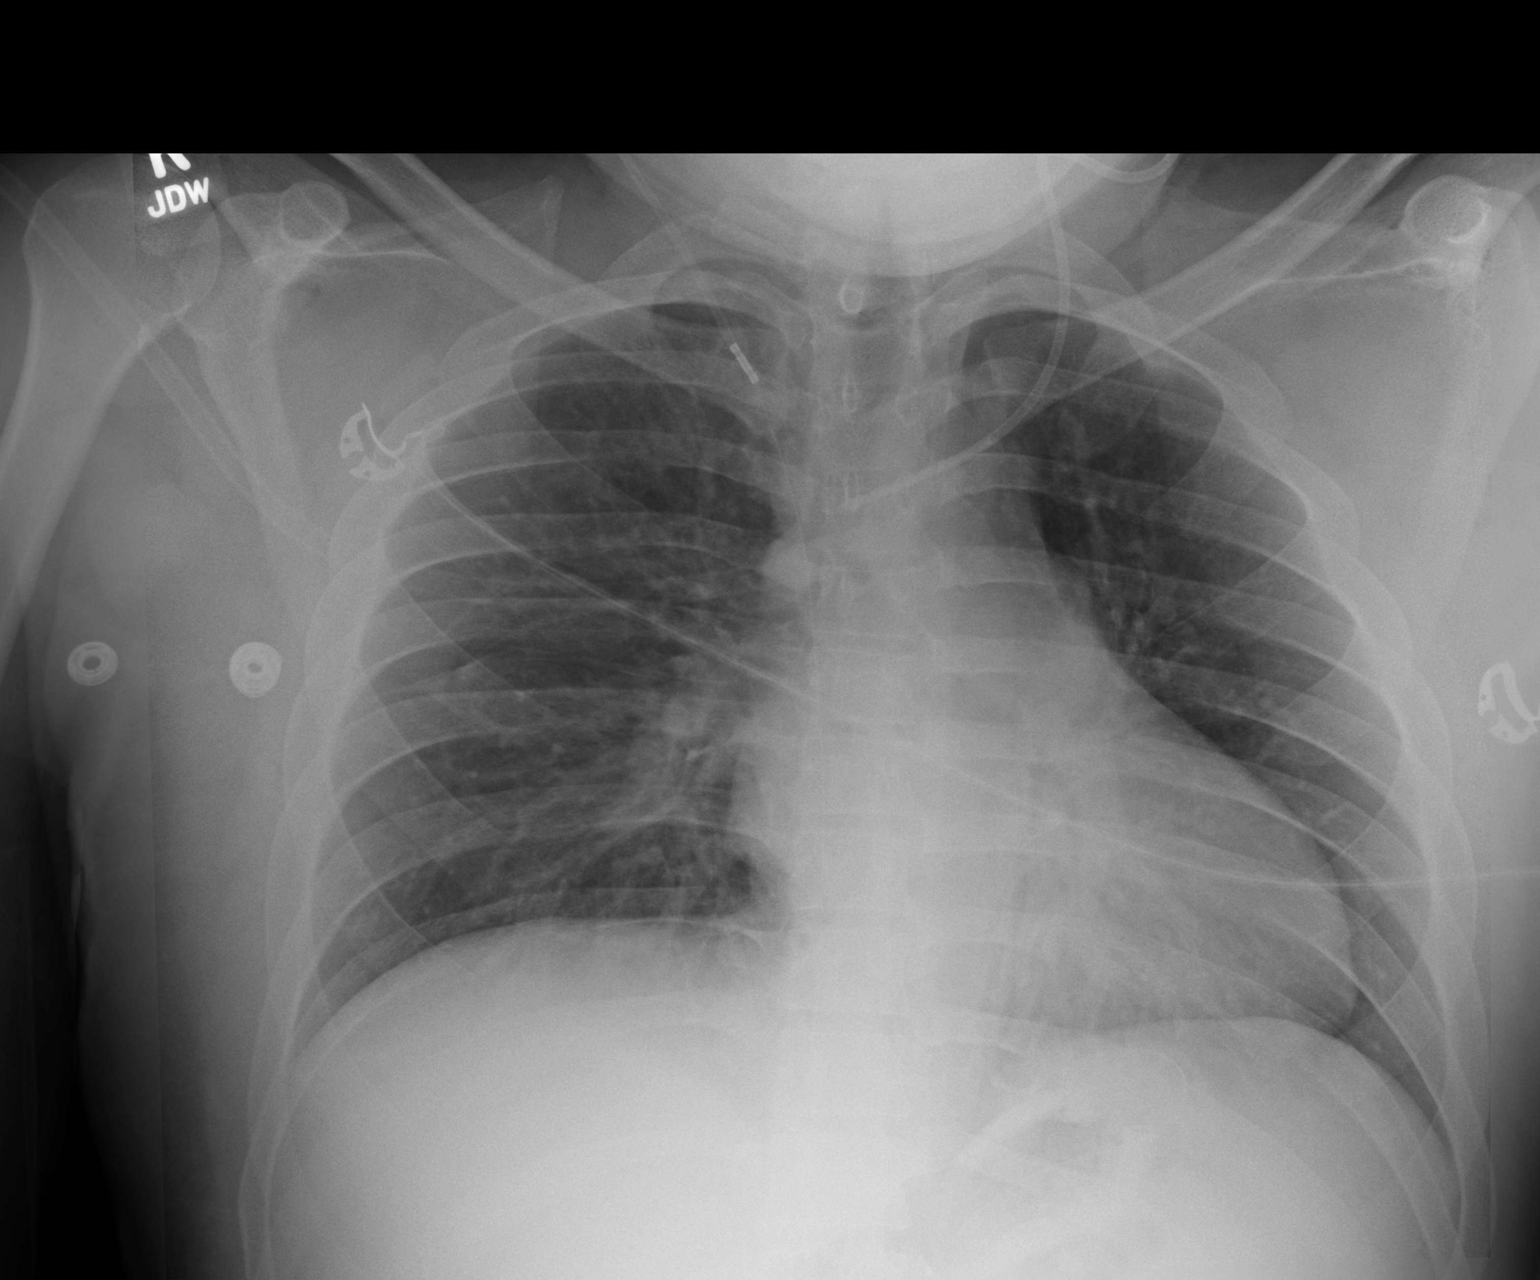

[1 of 1 positions shown; findings below may reference images not displayed]

FINDINGS: The heart size and mediastinal contours are within normal limits.
Both lungs are clear. New left IJ central line catheter seen in the
distal left brachiocephalic vein near the juncture with the SVC.
Metallic indicator from apparent new shunt tubing projects over the
right upper lobe at the level of the posterior third rib.
IMPRESSION: 1. Left IJ central line catheter seen in the expected location of
the distal left brachiocephalic vein -SVC junction. No pneumothorax.
2. Indicator from right-sided ventricular shunt is noted projecting
over the posterior right third rib level.

## 2022-09-03 ENCOUNTER — Ambulatory Visit
Admission: EM | Admit: 2022-09-03 | Discharge: 2022-09-03 | Disposition: A | Discharge status: Home / Self Care | Payer: No Typology Code available for payment source | Attending: Family Medicine | Admitting: Family Medicine

## 2022-09-03 DIAGNOSIS — J101 Influenza due to other identified influenza virus with other respiratory manifestations: Secondary | ICD-10-CM

## 2022-09-03 LAB — POCT INFLUENZA A/B
Influenza A, POC: POSITIVE — AB
Influenza B, POC: NEGATIVE

## 2022-09-03 MED ORDER — ACETAMINOPHEN 325 MG PO TABS
650.0000 mg | ORAL_TABLET | Freq: Once | ORAL | Status: AC
  Administered 2022-09-03: 650 mg via ORAL

## 2022-09-03 MED ORDER — HYDROCODONE BIT-HOMATROP MBR 5-1.5 MG/5ML PO SOLN: 5.0000 mL | Freq: Four times a day (QID) | ORAL | 0 refills | Status: AC | PRN

## 2022-09-03 MED ORDER — OSELTAMIVIR PHOSPHATE 75 MG PO CAPS: 75.0000 mg | ORAL_CAPSULE | Freq: Two times a day (BID) | ORAL | 0 refills | Status: AC

## 2022-09-03 NOTE — Discharge Instructions (Signed)
Be aware, your cough medication may cause drowsiness. Please do not drive, operate heavy machinery or make important decisions while on this medication, it can cloud your judgement.  

## 2022-09-03 NOTE — ED Triage Notes (Signed)
Pt presents with c/o chills, fever, body aches since last night.   States he has some congestion.

## 2022-09-05 NOTE — ED Provider Notes (Signed)
[facility]   [id number] 09/03/22 Arrival Time: [id number]  ASSESSMENT & PLAN:  1. Influenza A    Discussed typical duration of influenza. Results for orders placed or performed during the hospital encounter of 09/03/22  POCT Influenza A/B  Result Value Ref Range   Influenza A, POC Positive (A) Negative   Influenza B, POC Negative Negative    OTC symptom care as needed.  Discharge Medication List as of 09/03/2022  4:04 PM     START taking these medications   Details  HYDROcodone bit-homatropine (HYCODAN) 5-1.5 MG/5ML syrup Take 5 mLs by mouth every 6 (six) hours as needed for cough., Starting Sat 09/03/2022, Normal    oseltamivir (TAMIFLU) 75 MG capsule Take 1 capsule (75 mg total) by mouth 2 (two) times daily for 5 days., Starting Sat 09/03/2022, Until Thu 09/08/2022, Normal         Follow-up Information     [facility]).   Specialty: Urgent Care Why: As needed. Contact information: [address] [place] [id number] [phone]                Reviewed expectations re: course of current medical issues. Questions answered. Outlined signs and symptoms indicating need for more acute intervention. Understanding verbalized. After Visit Summary given.   SUBJECTIVE: History from: Patient. Miguel Stafford is a 27 y.o. male. Reports: Pt presents with c/o chills, fever, body aches since last night.   States he has some congestion.  . Denies: difficulty breathing. Normal PO intake without n/v/d.  OBJECTIVE:  Vitals:   09/03/22 1533  BP: 129/83  Pulse: (!) 113  Resp: 20  Temp: (!) 102.7 F (39.3 C)  SpO2: 94%    General appearance: alert; no distress Eyes: PERRLA; EOMI; conjunctiva normal HENT: Slick; AT; with nasal congestion Neck: supple  Lungs: speaks full sentences without difficulty; unlabored Extremities: no edema Skin: warm and dry Neurologic: normal  gait Psychological: alert and cooperative; normal mood and affect  Labs: Results for orders placed or performed during the hospital encounter of 09/03/22  POCT Influenza A/B  Result Value Ref Range   Influenza A, POC Positive (A) Negative   Influenza B, POC Negative Negative   Labs Reviewed  POCT INFLUENZA A/B - Abnormal; Notable for the following components:      Result Value   Influenza A, POC Positive (*)    All other components within normal limits    Imaging: No results found.  No Known Allergies  Past Medical History:  Diagnosis Date   Meningitis    Seizures ([place])    Social History   Socioeconomic History   Marital status: Single    Spouse name: Not on file   Number of children: Not on file   Years of education: Not on file   Highest education level: Not on file  Occupational History   Not on file  Tobacco Use   Smoking status: Never   Smokeless tobacco: Never  Substance and Sexual Activity   Alcohol use: No   Drug use: No   Sexual activity: Not on file  Other Topics Concern   Not on file  Social History Narrative   Not on file   Social Determinants of Health   Financial Resource Strain: Not on file  Food Insecurity: Not on file  Transportation Needs: Not on file  Physical Activity: Not on file  Stress: Not on file  Social Connections: Not on file  Intimate Partner Violence: Not on file   History reviewed. No pertinent family history. Past Surgical History:  Procedure Laterality Date   SHUNT REVISION Right 03/05/2017   Procedure: RIGHT SHUNT REVISION;  Surgeon: Ashok Pall, MD;  Location: [facility];  Service: Neurosurgery;  Laterality: Right;  right      Vanessa Kick, MD 09/05/22 [phone]
